# Patient Record
Sex: Female | Born: 1950 | Race: Black or African American | Hispanic: No | Smoking: Never smoker
Health system: Southern US, Community
[De-identification: ages and names within clinical notes are randomized; demographics above are authoritative.]

## PROBLEM LIST (undated history)

## (undated) DIAGNOSIS — M069 Rheumatoid arthritis, unspecified: Secondary | ICD-10-CM

## (undated) HISTORY — PX: JOINT REPLACEMENT: SHX530

## (undated) HISTORY — PX: OTHER SURGICAL HISTORY: SHX169

---

## 2012-10-16 ENCOUNTER — Encounter (HOSPITAL_BASED_OUTPATIENT_CLINIC_OR_DEPARTMENT_OTHER): Payer: Self-pay | Admitting: *Deleted

## 2012-10-16 ENCOUNTER — Emergency Department (HOSPITAL_BASED_OUTPATIENT_CLINIC_OR_DEPARTMENT_OTHER)
Admission: EM | Admit: 2012-10-16 | Discharge: 2012-10-16 | Disposition: A | Discharge status: Home / Self Care | Payer: Medicare (Managed Care) | Attending: Emergency Medicine | Admitting: Emergency Medicine

## 2012-10-16 ENCOUNTER — Emergency Department (HOSPITAL_BASED_OUTPATIENT_CLINIC_OR_DEPARTMENT_OTHER): Payer: Medicare (Managed Care)

## 2012-10-16 DIAGNOSIS — Z79899 Other long term (current) drug therapy: Secondary | ICD-10-CM | POA: Insufficient documentation

## 2012-10-16 DIAGNOSIS — M5431 Sciatica, right side: Secondary | ICD-10-CM

## 2012-10-16 DIAGNOSIS — Z96649 Presence of unspecified artificial hip joint: Secondary | ICD-10-CM | POA: Insufficient documentation

## 2012-10-16 DIAGNOSIS — IMO0002 Reserved for concepts with insufficient information to code with codable children: Secondary | ICD-10-CM | POA: Insufficient documentation

## 2012-10-16 DIAGNOSIS — M069 Rheumatoid arthritis, unspecified: Secondary | ICD-10-CM | POA: Insufficient documentation

## 2012-10-16 DIAGNOSIS — M545 Low back pain, unspecified: Secondary | ICD-10-CM | POA: Insufficient documentation

## 2012-10-16 HISTORY — DX: Rheumatoid arthritis, unspecified: M06.9

## 2012-10-16 MED ORDER — OXYCODONE-ACETAMINOPHEN 5-325 MG PO TABS: 2.0000 | ORAL_TABLET | ORAL | Status: DC | PRN

## 2012-10-16 MED ORDER — OXYCODONE-ACETAMINOPHEN 5-325 MG PO TABS
1.0000 | ORAL_TABLET | Freq: Once | ORAL | Status: AC
  Administered 2012-10-16: 1 via ORAL
  Filled 2012-10-16 (×2): qty 1

## 2012-10-16 MED ORDER — CYCLOBENZAPRINE HCL 5 MG PO TABS: 5.0000 mg | ORAL_TABLET | Freq: Two times a day (BID) | ORAL | Status: AC | PRN

## 2012-10-16 MED ORDER — CYCLOBENZAPRINE HCL 10 MG PO TABS
5.0000 mg | ORAL_TABLET | Freq: Once | ORAL | Status: AC
  Administered 2012-10-16: 5 mg via ORAL
  Filled 2012-10-16: qty 2

## 2012-10-16 NOTE — ED Provider Notes (Signed)
CSN: [id number]     Arrival date & time 10/16/12  1936 History     First MD Initiated Contact with Patient 10/16/12 1942     Chief Complaint  Patient presents with  . Hip Pain   (Consider location/radiation/quality/duration/timing/severity/associated sxs/prior Treatment) HPI Comments: Pt states that she has been having right hip pain for the last week:pain is worse with wt bearing:pt states that she has has a total hip replacement on that side from 20 years [id number] any injury:pt has been taking her normal medications  Patient is a 62 y.o. female presenting with hip pain. The history is provided by the patient. No language interpreter was used.  Hip Pain This is a new problem. The current episode started in the past 7 days. The problem occurs constantly. The problem has been unchanged. Pertinent negatives include no fever. The symptoms are aggravated by walking.    Past Medical History  Diagnosis Date  . Rheumatoid arthritis    Past Surgical History  Procedure Laterality Date  . Joint replacement     History reviewed. No pertinent family history. History  Substance Use Topics  . Smoking status: Never Smoker   . Smokeless tobacco: Not on file  . Alcohol Use: No   OB History   Grav Para Term Preterm Abortions TAB SAB Ect Mult Living                 Review of Systems  Constitutional: Negative for fever.  Respiratory: Negative.   Cardiovascular: Negative.     Allergies  Review of patient's allergies indicates no known allergies.  Home Medications   Current Outpatient Rx  Name  Route  Sig  Dispense  Refill  . acetaminophen-codeine (TYLENOL #4) 300-60 MG per tablet   Oral   Take 1 tablet by mouth every 4 (four) hours as needed for pain.         . celecoxib (CELEBREX) 100 MG capsule   Oral   Take 100 mg by mouth 2 (two) times daily.         Marland Kitchen etanercept (ENBREL) 50 MG/ML injection   Subcutaneous   Inject 50 mg into the skin once a week.         .  predniSONE (DELTASONE) 5 MG tablet   Oral   Take 5 mg by mouth daily.          BP 173/76  Pulse 75  Temp(Src) 97.9 F (36.6 C) (Oral)  Resp 18  SpO2 100% Physical Exam  Nursing note and vitals reviewed. Constitutional: She is oriented to person, place, and time. She appears well-developed and well-nourished.  Cardiovascular: Normal rate and regular rhythm.   Pulmonary/Chest: Effort normal and breath sounds normal.  Musculoskeletal:  Pt has right sciatic notch tenderness:pt has full rom:no shortening or rotation noted:no swelling noted to the area  Neurological: She is alert and oriented to person, place, and time.  Skin: Skin is warm and dry.  Psychiatric: She has a normal mood and affect.    ED Course   Procedures (including critical care time)  Labs Reviewed - No data to display Dg Hip Complete Right  10/16/2012   *RADIOLOGY REPORT*  Clinical Data: Right hip pain, remote right hip replacement  RIGHT HIP - COMPLETE 2+ VIEW  Comparison: None.  Findings: Right total hip arthroplasty noted.  No evidence for hardware failure.  No fracture or dislocation identified.  IMPRESSION: No acute abnormality.   Original Report Authenticated By: Christiana Pellant, M.D.   1.  Sciatica, right     MDM  No hardware abnormality noted:pt is here form out of town will given something for pain and muscle relaxer:doubt infection  Teressa Lower, NP 10/16/12 2035

## 2012-10-16 NOTE — ED Notes (Signed)
C/o right hip pain from RA, no relief from home meds

## 2012-10-16 NOTE — ED Notes (Signed)
Patient transported to X-ray 

## 2012-10-19 NOTE — ED Provider Notes (Signed)
Medical screening examination/treatment/procedure(s) were performed by non-physician practitioner and as supervising physician I was immediately available for consultation/collaboration.   Candyce Churn, MD 10/19/12 2116

## 2012-10-26 ENCOUNTER — Encounter (HOSPITAL_BASED_OUTPATIENT_CLINIC_OR_DEPARTMENT_OTHER): Payer: Self-pay | Admitting: Emergency Medicine

## 2012-10-26 ENCOUNTER — Emergency Department (HOSPITAL_BASED_OUTPATIENT_CLINIC_OR_DEPARTMENT_OTHER)
Admission: EM | Admit: 2012-10-26 | Discharge: 2012-10-26 | Disposition: A | Discharge status: Home / Self Care | Payer: Medicare (Managed Care) | Attending: Emergency Medicine | Admitting: Emergency Medicine

## 2012-10-26 ENCOUNTER — Emergency Department (HOSPITAL_BASED_OUTPATIENT_CLINIC_OR_DEPARTMENT_OTHER): Payer: Medicare (Managed Care)

## 2012-10-26 DIAGNOSIS — M5430 Sciatica, unspecified side: Secondary | ICD-10-CM | POA: Insufficient documentation

## 2012-10-26 DIAGNOSIS — M069 Rheumatoid arthritis, unspecified: Secondary | ICD-10-CM | POA: Insufficient documentation

## 2012-10-26 DIAGNOSIS — Z79899 Other long term (current) drug therapy: Secondary | ICD-10-CM | POA: Insufficient documentation

## 2012-10-26 DIAGNOSIS — Z96649 Presence of unspecified artificial hip joint: Secondary | ICD-10-CM | POA: Insufficient documentation

## 2012-10-26 DIAGNOSIS — M5431 Sciatica, right side: Secondary | ICD-10-CM

## 2012-10-26 DIAGNOSIS — IMO0002 Reserved for concepts with insufficient information to code with codable children: Secondary | ICD-10-CM | POA: Insufficient documentation

## 2012-10-26 MED ORDER — DIAZEPAM 5 MG PO TABS: 5.0000 mg | ORAL_TABLET | Freq: Three times a day (TID) | ORAL | Status: AC | PRN

## 2012-10-26 MED ORDER — OXYCODONE-ACETAMINOPHEN 5-325 MG PO TABS: 1.0000 | ORAL_TABLET | Freq: Three times a day (TID) | ORAL | Status: AC | PRN

## 2012-10-26 MED ORDER — OXYCODONE-ACETAMINOPHEN 5-325 MG PO TABS
1.0000 | ORAL_TABLET | Freq: Once | ORAL | Status: AC
  Administered 2012-10-26: 1 via ORAL
  Filled 2012-10-26 (×2): qty 1

## 2012-10-26 MED ORDER — DIAZEPAM 5 MG PO TABS
5.0000 mg | ORAL_TABLET | Freq: Once | ORAL | Status: AC
  Administered 2012-10-26: 5 mg via ORAL
  Filled 2012-10-26: qty 1

## 2012-10-26 NOTE — ED Provider Notes (Signed)
CSN: [id number]     Arrival date & time 10/26/12  6295 History     First MD Initiated Contact with Patient 10/26/12 [phone]     Chief Complaint  Patient presents with  . Hip Pain   (Consider location/radiation/quality/duration/timing/severity/associated sxs/prior Treatment) HPI Comments: Right hip pain. Starts in her right buttock. Radiate from the back of her leg suddenly. Seen 1 week ago given pain pills and muscle relaxants.  Patient is a 62 y.o. female presenting with hip pain. The history is provided by the patient.  Hip Pain This is a recurrent problem. The current episode started more than 1 week ago. The problem has not changed since onset.Pertinent negatives include no chest pain, no abdominal pain, no headaches and no shortness of breath. Nothing aggravates the symptoms. Nothing relieves the symptoms.    Past Medical History  Diagnosis Date  . Rheumatoid arthritis    Past Surgical History  Procedure Laterality Date  . Joint replacement    . Hip replaced     History reviewed. No pertinent family history. History  Substance Use Topics  . Smoking status: Never Smoker   . Smokeless tobacco: Not on file  . Alcohol Use: No   OB History   Grav Para Term Preterm Abortions TAB SAB Ect Mult Living                 Review of Systems  Constitutional: Negative for fever.  Respiratory: Negative for cough and shortness of breath.   Cardiovascular: Negative for chest pain.  Gastrointestinal: Negative for abdominal pain.  Neurological: Negative for headaches.  All other systems reviewed and are negative.    Allergies  Review of patient's allergies indicates no known allergies.  Home Medications   Current Outpatient Rx  Name  Route  Sig  Dispense  Refill  . celecoxib (CELEBREX) 100 MG capsule   Oral   Take 100 mg by mouth 2 (two) times daily.         Marland Kitchen etanercept (ENBREL) 50 MG/ML injection   Subcutaneous   Inject 50 mg into the skin once a week.         Marland Kitchen  acetaminophen-codeine (TYLENOL #4) 300-60 MG per tablet   Oral   Take 1 tablet by mouth every 4 (four) hours as needed for pain.         . cyclobenzaprine (FLEXERIL) 5 MG tablet   Oral   Take 1 tablet (5 mg total) by mouth 2 (two) times daily as needed for muscle spasms.   20 tablet   0   . oxyCODONE-acetaminophen (PERCOCET/ROXICET) 5-325 MG per tablet   Oral   Take 2 tablets by mouth every 4 (four) hours as needed for pain.   20 tablet   0   . predniSONE (DELTASONE) 5 MG tablet   Oral   Take 5 mg by mouth daily.          BP 153/75  Pulse 52  Temp(Src) 98.1 F (36.7 C) (Oral)  Resp 16  SpO2 100% Physical Exam  Nursing note and vitals reviewed. Constitutional: She is oriented to person, place, and time. She appears well-developed and well-nourished. No distress.  HENT:  Head: Normocephalic and atraumatic.  Eyes: EOM are normal. Pupils are equal, round, and reactive to light.  Neck: Normal range of motion. Neck supple.  Cardiovascular: Normal rate and regular rhythm.  Exam reveals no friction rub.   No murmur heard. Pulmonary/Chest: Effort normal and breath sounds normal. No respiratory distress. She  has no wheezes. She has no rales.  Abdominal: Soft. She exhibits no distension. There is no tenderness. There is no rebound.  Musculoskeletal: Normal range of motion. She exhibits no edema.       Right hip: She exhibits tenderness (lateral buttock).       Right knee: Normal.       Lumbar back: She exhibits no tenderness.  Full range of motion of right hip. Straight leg test on the right is positive. Normal patellar reflex in the right leg. Normal strength and sensation in the right leg. No weakness. Normal pulses.  Neurological: She is alert and oriented to person, place, and time.  Skin: She is not diaphoretic.    ED Course   Procedures (including critical care time)  Labs Reviewed - No data to display Dg Lumbar Spine Complete  10/26/2012   *RADIOLOGY REPORT*   Clinical Data: Low back pain radiating into the right hip. Numbness and paresthesias in the right lower extremity.  Current history of rheumatoid arthritis.  LUMBAR SPINE - COMPLETE 4+ VIEW  Comparison: None.  Findings: Five non-rib bearing lumbar vertebrae with anatomic alignment.  No fractures.  Mild disc space narrowing and endplate hypertrophic changes at L3-4, L4-5 and L5-S1.  Mild spondylosis involving the endplates of L2 and L3 with well-preserved disc space at L2-3.  Facet degenerative changes at L4-5 and L5-S1, right greater than left.  IMPRESSION: Mild degenerative disc disease and spondylosis at L3-4, L4-5, and L5-S1.  Facet degenerative changes at L4-5 and L5-S1.   Original Report Authenticated By: Hulan Saas, M.D.   Dg Pelvis 1-2 Views  10/26/2012   *RADIOLOGY REPORT*  Clinical Data: Low back pain and right hip pain.  History of rheumatoid arthritis.  Prior right hip arthroplasty.  PELVIS - 1-2 VIEW  Comparison: Right hip x-rays with AP pelvis 10/16/2012.  Findings: Right hip arthroplasty with anatomic alignment.  No complicating features.  Well-preserved joint space in the left hip. Bone mineral density well preserved.  Sacroiliac joints symphysis pubis intact.  IMPRESSION: Right hip arthroplasty with anatomic alignment and no complicating features.  No acute or significant osseous abnormality involving the pelvis.   Original Report Authenticated By: Hulan Saas, M.D.   1. Sciatica neuralgia, right     MDM  62 year old female with history of right hip replacement and severe rheumatoid arthritis presents with right hip pain. Evaluated a week ago for this hip pain. X-rays that time are normal. Given pain control and muscle relaxers. Patient still here with persistent sciatic type nerve pain. States it starts in the right buttock cheek and radiates down the back and sometimes side of her right leg. Denies any numbness tingling or weakness. Denies any lumbar pain. Denies any new trauma.  No fever, nausea, vomiting or diarrhea. No urinary tract symptoms. Patient is concerned about infection due to her being on Enbrel. She denies any other systemic symptoms. I do not feel like she has a hip infection based on symptoms. She can fully range her hip without pain. She does have a positive straight leg test on the right. I counseled her on her sciatica about requiring MRI, however we both agree it does not need to happen emergently. She states she can get that done in [place]. She is amenable to L. spine x-ray to ensure no pathologic fractures causing her sciatic nerve pain. We'll give pain medicine and Valium here today.  Xrays normal. Stable for discharge. I have reviewed all labs and imaging and considered them  in my medical decision making.   Dagmar Hait, MD 10/26/12 1031

## 2012-10-26 NOTE — ED Notes (Signed)
Patient states that she was here last week for the same pain, the pain has not improved and she continues to have 10/10

## 2014-03-16 ENCOUNTER — Emergency Department (HOSPITAL_BASED_OUTPATIENT_CLINIC_OR_DEPARTMENT_OTHER): Payer: Medicare Other

## 2014-03-16 ENCOUNTER — Encounter (HOSPITAL_BASED_OUTPATIENT_CLINIC_OR_DEPARTMENT_OTHER): Payer: Self-pay

## 2014-03-16 ENCOUNTER — Emergency Department (HOSPITAL_BASED_OUTPATIENT_CLINIC_OR_DEPARTMENT_OTHER)
Admission: EM | Admit: 2014-03-16 | Discharge: 2014-03-16 | Disposition: A | Discharge status: Home / Self Care | Payer: Medicare Other | Attending: Emergency Medicine | Admitting: Emergency Medicine

## 2014-03-16 DIAGNOSIS — Z791 Long term (current) use of non-steroidal anti-inflammatories (NSAID): Secondary | ICD-10-CM | POA: Insufficient documentation

## 2014-03-16 DIAGNOSIS — Z79899 Other long term (current) drug therapy: Secondary | ICD-10-CM | POA: Insufficient documentation

## 2014-03-16 DIAGNOSIS — M25551 Pain in right hip: Secondary | ICD-10-CM | POA: Diagnosis present

## 2014-03-16 DIAGNOSIS — M069 Rheumatoid arthritis, unspecified: Secondary | ICD-10-CM | POA: Diagnosis not present

## 2014-03-16 DIAGNOSIS — R52 Pain, unspecified: Secondary | ICD-10-CM

## 2014-03-16 DIAGNOSIS — Z7952 Long term (current) use of systemic steroids: Secondary | ICD-10-CM | POA: Insufficient documentation

## 2014-03-16 DIAGNOSIS — M5416 Radiculopathy, lumbar region: Secondary | ICD-10-CM

## 2014-03-16 MED ORDER — METHOCARBAMOL 500 MG PO TABS: 500.0000 mg | ORAL_TABLET | Freq: Two times a day (BID) | ORAL | Status: AC

## 2014-03-16 MED ORDER — HYDROMORPHONE HCL 1 MG/ML IJ SOLN
1.0000 mg | Freq: Once | INTRAMUSCULAR | Status: AC
  Administered 2014-03-16: 1 mg via INTRAMUSCULAR
  Filled 2014-03-16: qty 1

## 2014-03-16 MED ORDER — OXYCODONE-ACETAMINOPHEN 5-325 MG PO TABS: 1.0000 | ORAL_TABLET | Freq: Four times a day (QID) | ORAL | Status: AC | PRN

## 2014-03-16 MED ORDER — KETOROLAC TROMETHAMINE 60 MG/2ML IM SOLN
60.0000 mg | Freq: Once | INTRAMUSCULAR | Status: AC
  Administered 2014-03-16: 60 mg via INTRAMUSCULAR
  Filled 2014-03-16: qty 2

## 2014-03-16 NOTE — ED Provider Notes (Addendum)
CSN: [id number]     Arrival date & time 03/16/14  1007 History   First MD Initiated Contact with Patient 03/16/14 1019     Chief Complaint  Patient presents with  . Hip Pain     (Consider location/radiation/quality/duration/timing/severity/associated sxs/prior Treatment) Patient is a 64 y.o. female presenting with hip pain. The history is provided by the patient.  Hip Pain This is a recurrent problem. Episode onset: 12 days. The problem occurs constantly. The problem has been gradually worsening. Associated symptoms comments: No fever, leg weakness, swelling. The symptoms are aggravated by walking, bending and twisting. Nothing relieves the symptoms. She has tried acetaminophen and rest for the symptoms. The treatment provided no relief.    Past Medical History  Diagnosis Date  . Rheumatoid arthritis    Past Surgical History  Procedure Laterality Date  . Joint replacement    . Hip replaced     No family history on file. History  Substance Use Topics  . Smoking status: Never Smoker   . Smokeless tobacco: Not on file  . Alcohol Use: No   OB History    No data available     Review of Systems  All other systems reviewed and are negative.     Allergies  Review of patient's allergies indicates no known allergies.  Home Medications   Prior to Admission medications   Medication Sig Start Date End Date Taking? Authorizing Provider  acetaminophen-codeine (TYLENOL #4) 300-60 MG per tablet Take 1 tablet by mouth every 4 (four) hours as needed for pain.    Historical Provider, MD  celecoxib (CELEBREX) 100 MG capsule Take 100 mg by mouth 2 (two) times daily.    Historical Provider, MD  cyclobenzaprine (FLEXERIL) 5 MG tablet Take 1 tablet (5 mg total) by mouth 2 (two) times daily as needed for muscle spasms. 10/16/12   Teressa Lower, NP  diazepam (VALIUM) 5 MG tablet Take 1 tablet (5 mg total) by mouth every 8 (eight) hours as needed for anxiety. 10/26/12   Elwin Mocha, MD    etanercept (ENBREL) 50 MG/ML injection Inject 50 mg into the skin once a week.    Historical Provider, MD  oxyCODONE-acetaminophen (PERCOCET/ROXICET) 5-325 MG per tablet Take 2 tablets by mouth every 4 (four) hours as needed for pain. 10/16/12   Teressa Lower, NP  oxyCODONE-acetaminophen (PERCOCET/ROXICET) 5-325 MG per tablet Take 1 tablet by mouth every 8 (eight) hours as needed for pain. 10/26/12   Elwin Mocha, MD  predniSONE (DELTASONE) 5 MG tablet Take 5 mg by mouth daily.    Historical Provider, MD   BP 137/88 mmHg  Pulse 102  Temp(Src) 98.5 F (36.9 C) (Oral)  Resp 18  Ht 5\' 2"  (1.575 m)  Wt 130 lb (58.968 kg)  BMI 23.77 kg/m2  SpO2 100% Physical Exam  Constitutional: She appears well-developed and well-nourished. No distress.  HENT:  Head: Normocephalic and atraumatic.  Eyes: EOM are normal. Pupils are equal, round, and reactive to light.  Cardiovascular: Normal rate.   Pulmonary/Chest: Effort normal.  Musculoskeletal:       Right hip: She exhibits tenderness and bony tenderness. She exhibits normal range of motion.       Legs: Neurological: She has normal strength. No sensory deficit.  Skin: Skin is warm and dry.  Psychiatric: She has a normal mood and affect. Her behavior is normal.  Nursing note and vitals reviewed.   ED Course  Procedures (including critical care time) Labs Review Labs Reviewed - No data  to display  Imaging Review Dg Hip Unilat With Pelvis 2-3 Views Right  03/16/2014   CLINICAL DATA:  Right hip pain for 1 week. No known injury. Hip replacement 15 years ago.  EXAM: DG HIP W/ PELVIS 2-3V*R*  COMPARISON:  10/26/2012  FINDINGS: Patient is status post right hip replacement. No fracture, subluxation or dislocation. There is lucency around the prosthesis within the proximal femur and also around the acetabular component in the ischium. These findings have progressed since prior study, especially the acetabular component. 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 No definite loosening. No fracture, subluxation or dislocation. Left tip is maintained. SI joints are symmetric and unremarkable.  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 This has progressed since prior study.   Electronically Signed   By: Charlett Nose M.D.   On: 03/16/2014 11:18     EKG Interpretation None      MDM   Final diagnoses:  Pain  Lumbar radiculopathy    Pt with gradual onset of back pain suggestive of sciatica.  No neurovascular compromise and no incontinence.  Pt has no infectious sx, hx of CA  or other red flags concerning for pathologic back pain.  Pt is able to ambulate but is painful.  Normal strength and reflexes on exam.  Denies trauma.  Hip films neg for acute pathology. Will give pt pain control and to return for developement of above sx.     Gwyneth Sprout, MD 03/16/14 1138  Gwyneth Sprout, MD 03/16/14 1140

## 2014-03-16 NOTE — ED Notes (Signed)
Reports pain since New Years Day. Right side hip pain radiating to right leg. Hx of right hip replacement and sciatica. Pt sts "I just want to make sure nothing is wrong with my replacement."

## 2015-07-26 IMAGING — CR DG HIP (WITH OR WITHOUT PELVIS) 2-3V*R*
3 series · 3 of 3 positions shown · non-contrast
Comparison: 10/26/2012

CLINICAL DATA: Right hip pain for 1 week. No known injury. Hip
replacement 15 years ago.

EXAM:
DG HIP W/ PELVIS 2-3V*R*

[t pelvis a.p.]
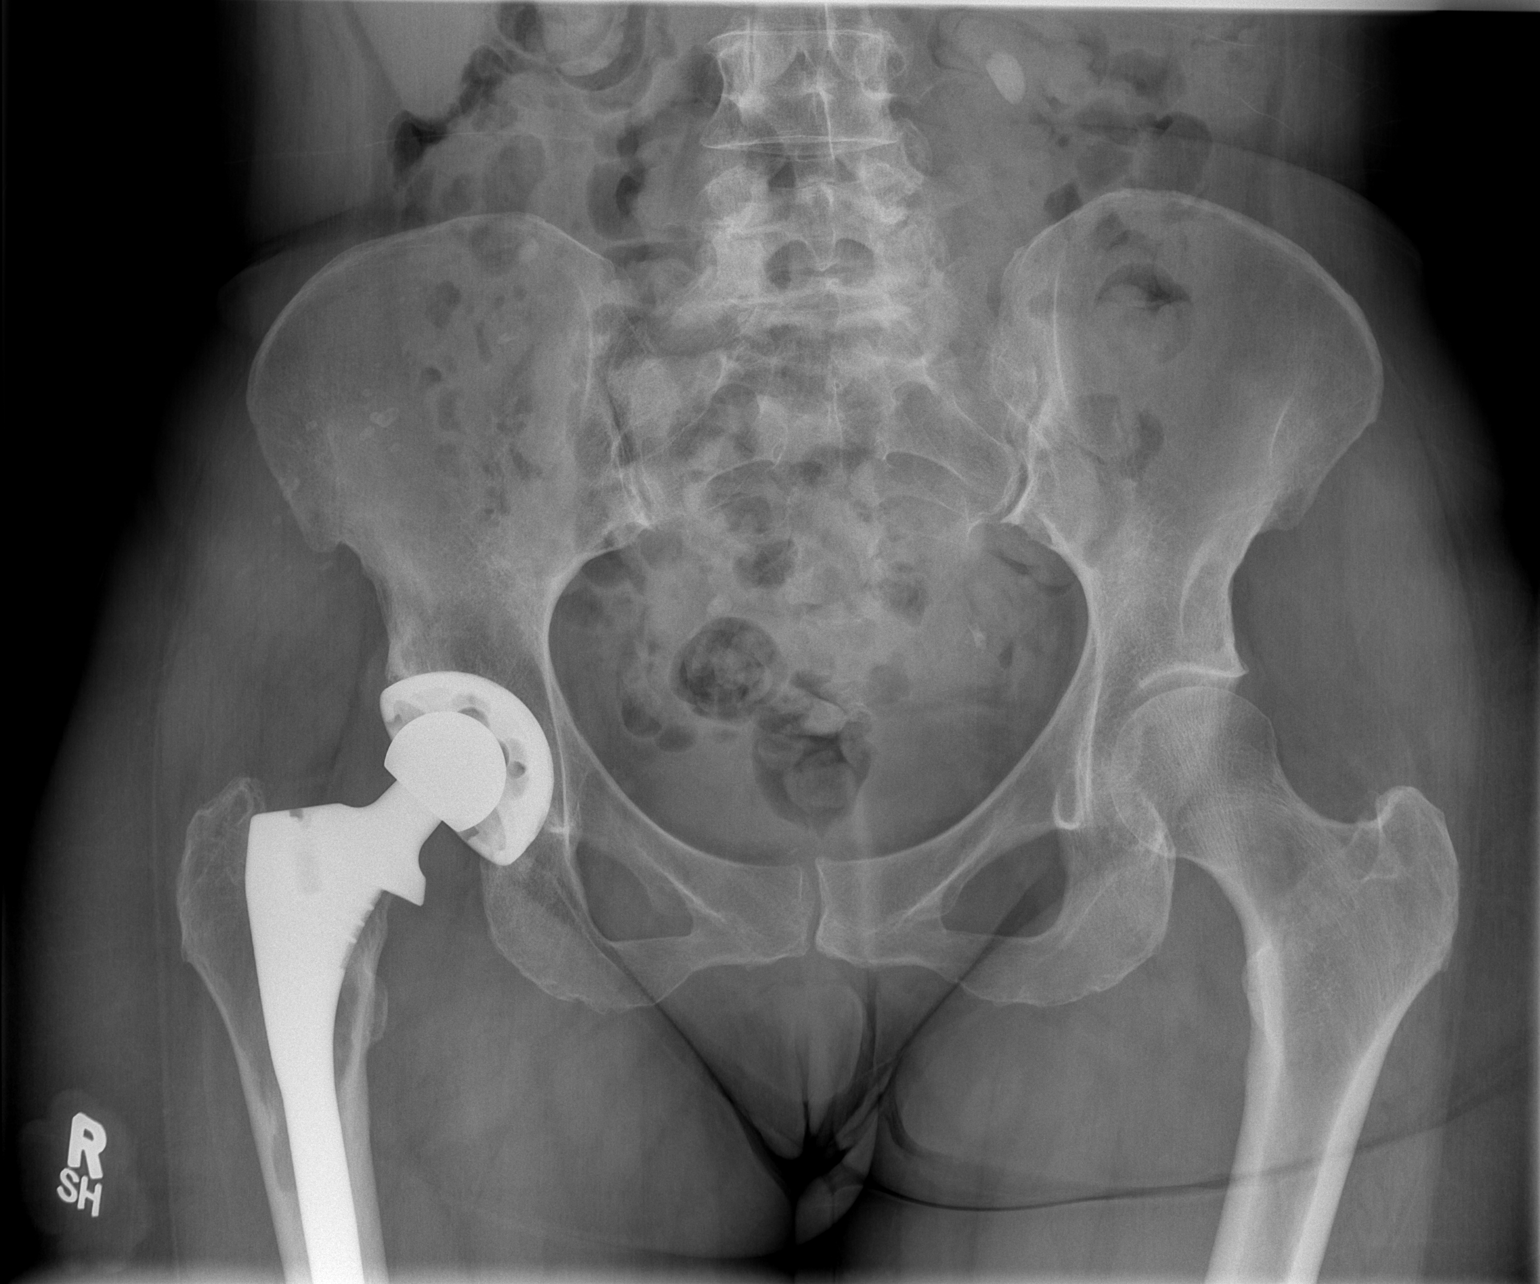

[t hip ap right]
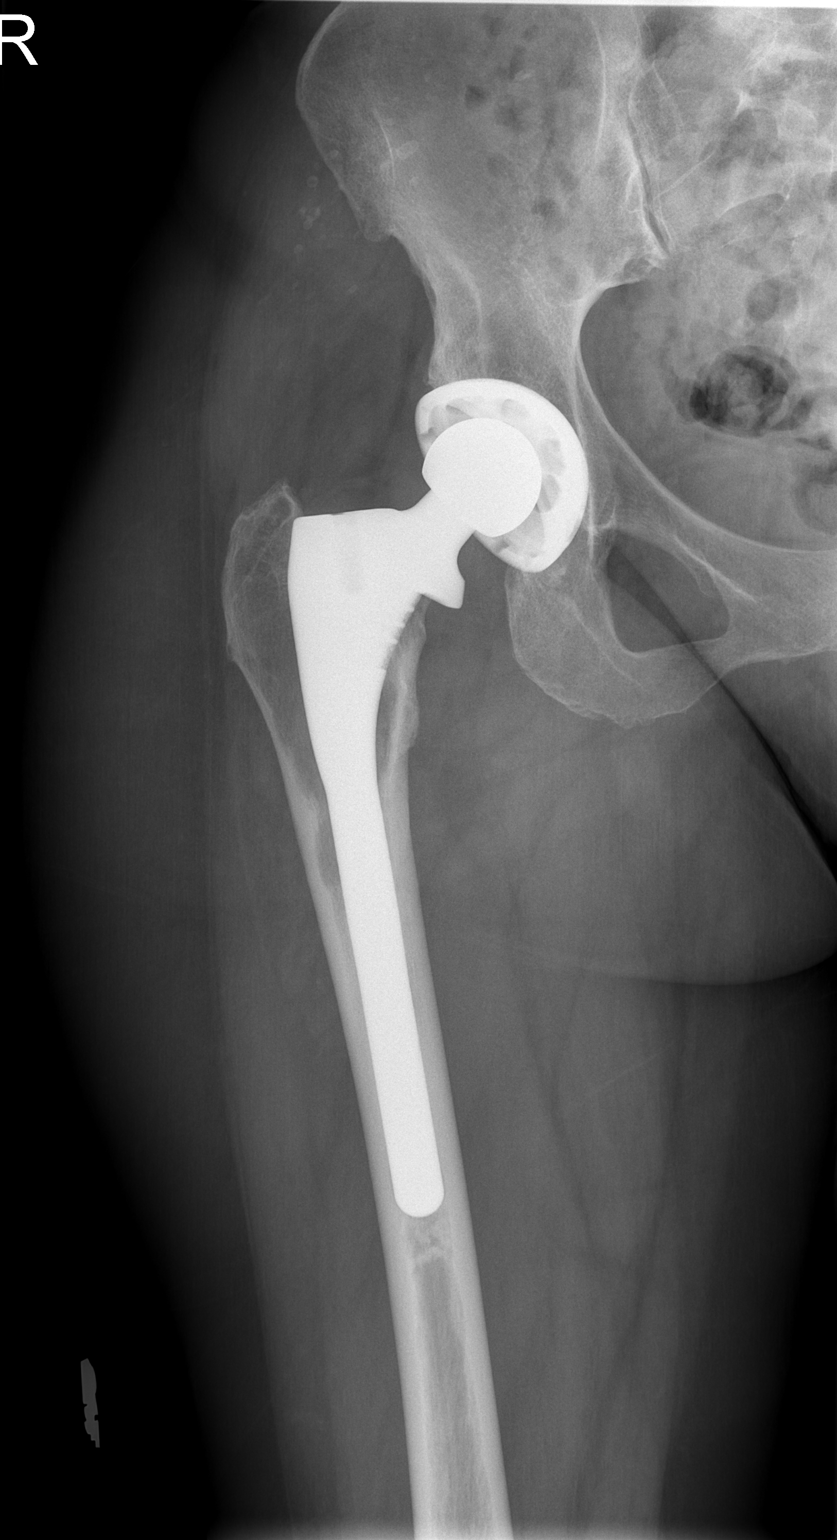

[t hip frog leg right]
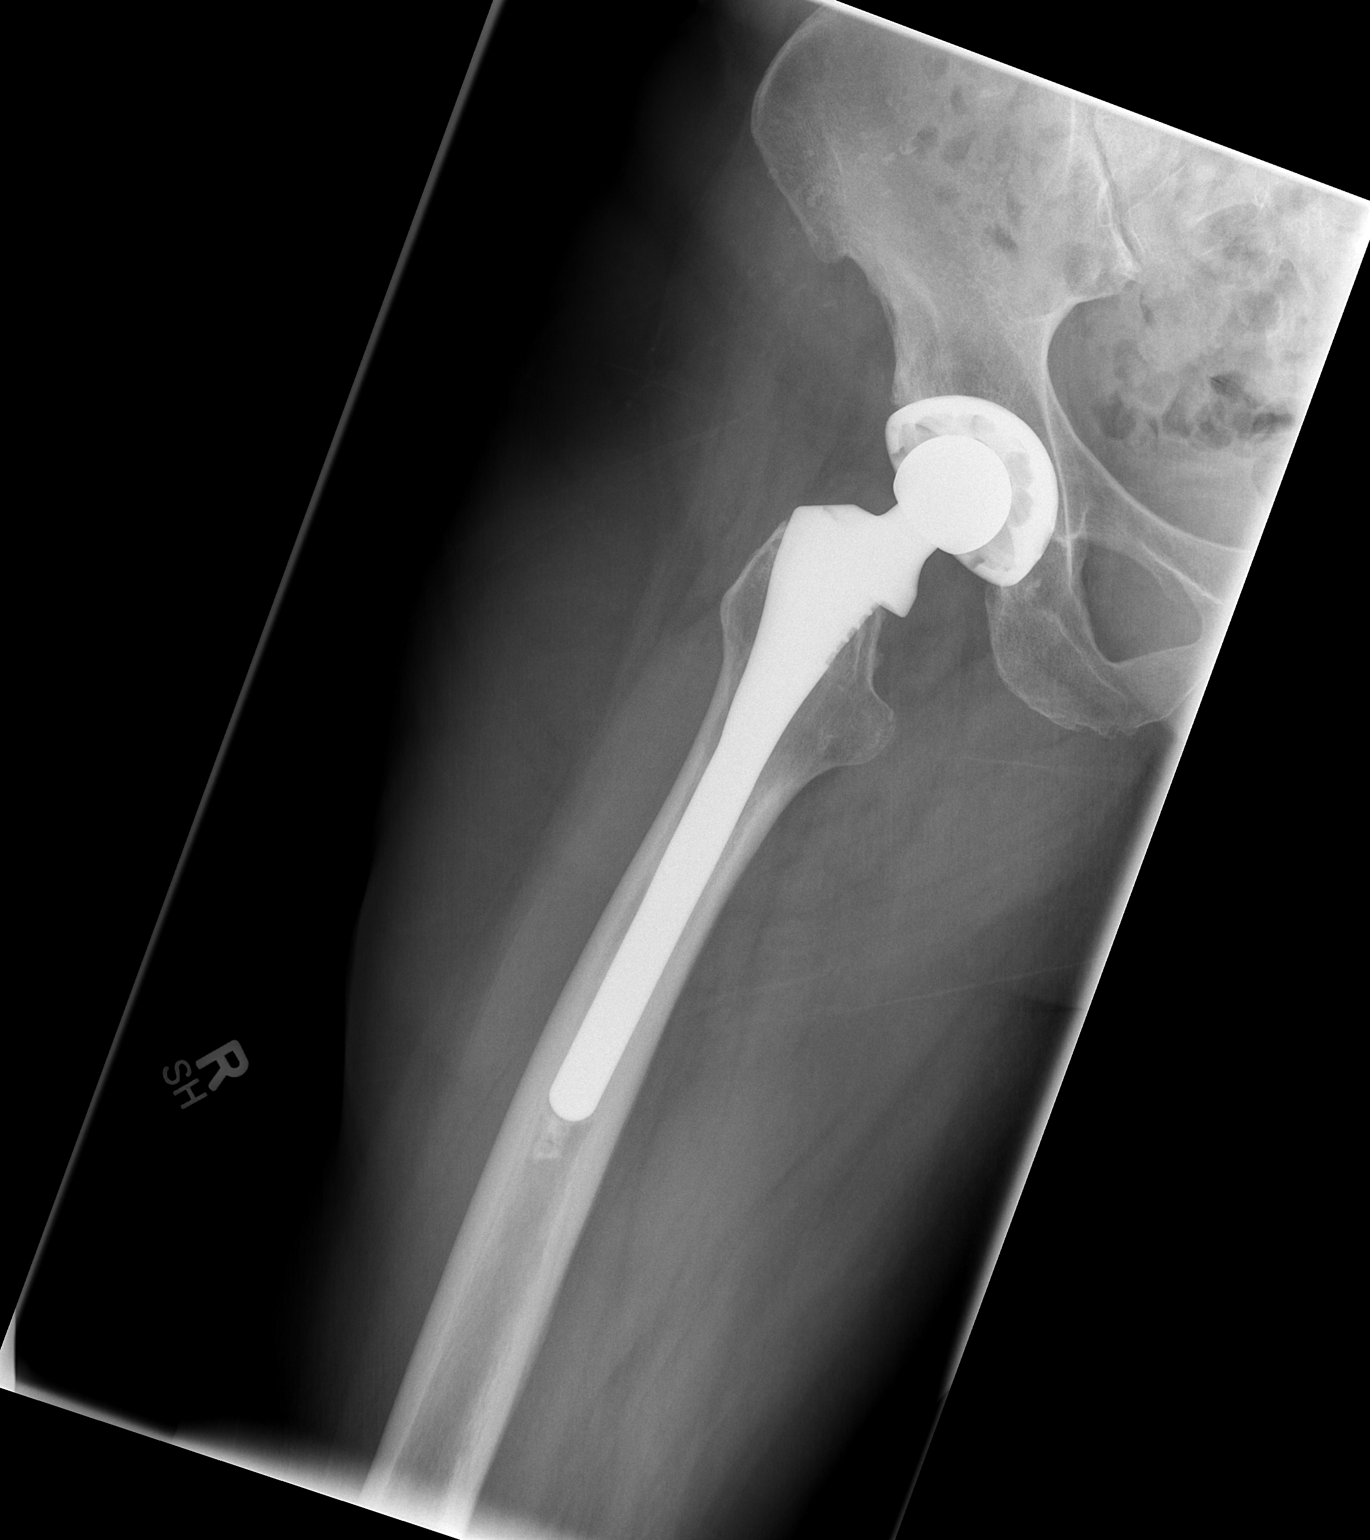

[3 of 3 positions shown; findings below may reference images not displayed]

FINDINGS: Patient is status post right hip replacement. No fracture,
subluxation or dislocation. There is lucency around the prosthesis
within the proximal femur and also around the acetabular component
in the ischium. These findings have progressed since prior study,
especially the acetabular component. This most likely represents
particle disease. No definite loosening. No fracture, subluxation or
dislocation. Left tip is maintained. SI joints are symmetric and
unremarkable.
IMPRESSION: Increasing lucency within the proximal femur and in the acetabulum,
likely reflecting particle disease. This has progressed since prior
study.
